# Patient Record
Sex: Male | Born: 2000 | Race: White | Hispanic: No | Marital: Single | Smoking: Never smoker
Health system: Southern US, Community
[De-identification: ages and names within clinical notes are randomized; demographics above are authoritative.]

## PROBLEM LIST (undated history)

## (undated) DIAGNOSIS — F419 Anxiety disorder, unspecified: Secondary | ICD-10-CM

## (undated) DIAGNOSIS — J45909 Unspecified asthma, uncomplicated: Secondary | ICD-10-CM

## (undated) DIAGNOSIS — F32A Depression, unspecified: Secondary | ICD-10-CM

## (undated) HISTORY — DX: Anxiety disorder, unspecified: F41.9

## (undated) HISTORY — DX: Depression, unspecified: F32.A

---
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
## 2022-01-15 DIAGNOSIS — F3289 Other specified depressive episodes: Secondary | ICD-10-CM | POA: Diagnosis not present

## 2022-01-15 DIAGNOSIS — F411 Generalized anxiety disorder: Secondary | ICD-10-CM | POA: Diagnosis not present

## 2022-01-15 DIAGNOSIS — F988 Other specified behavioral and emotional disorders with onset usually occurring in childhood and adolescence: Secondary | ICD-10-CM | POA: Diagnosis not present

## 2022-01-15 DIAGNOSIS — E559 Vitamin D deficiency, unspecified: Secondary | ICD-10-CM | POA: Diagnosis not present

## 2022-01-22 DIAGNOSIS — F909 Attention-deficit hyperactivity disorder, unspecified type: Secondary | ICD-10-CM | POA: Diagnosis not present

## 2022-01-25 ENCOUNTER — Telehealth: Payer: PRIVATE HEALTH INSURANCE | Admitting: Medical-Surgical

## 2022-01-28 ENCOUNTER — Telehealth (INDEPENDENT_AMBULATORY_CARE_PROVIDER_SITE_OTHER): Payer: BC Managed Care – PPO | Admitting: Sports Medicine

## 2022-01-28 DIAGNOSIS — J4530 Mild persistent asthma, uncomplicated: Secondary | ICD-10-CM | POA: Diagnosis not present

## 2022-01-28 MED ORDER — QVAR REDIHALER 80 MCG/ACT IN AERB: 1.0000 | INHALATION_SPRAY | Freq: Two times a day (BID) | RESPIRATORY_TRACT | 11 refills | Status: DC

## 2022-01-28 NOTE — Assessment & Plan Note (Signed)
Benjamin Carpenter is a pleasant 21 year old male, we had recently treated him for an asthma exacerbation, on further questioning he was having nighttime attacks 3 times per week and was only on albuterol. Added low-dose Qvar 40 mg twice a day, he is having approximately 1 nighttime attack per week now. No daytime symptoms over the past month. Increasing Qvar to 80 mg twice a day, return in 6 months. He will need pneumococcal 20 vaccination when he is 21 years old.

## 2022-01-28 NOTE — Progress Notes (Signed)
° °  Virtual Visit via WebEx/MyChart   I connected with  Benjamin Carpenter  on 01/28/22 via WebEx/MyChart/Doximity Video and verified that I am speaking with the correct person using two identifiers.   I discussed the limitations, risks, security and privacy concerns of performing an evaluation and management service by WebEx/MyChart/Doximity Video, including the higher likelihood of inaccurate diagnosis and treatment, and the availability of in person appointments.  We also discussed the likely need of an additional face to face encounter for complete and high quality delivery of care.  I also discussed with the patient that there may be a patient responsible charge related to this service. The patient expressed understanding and wishes to proceed.  Provider location is in medical facility. Patient location is at their home, different from provider location. People involved in care of the patient during this telehealth encounter were myself, my nurse/medical assistant, and my front office/scheduling team member.  Review of Systems: No fevers, chills, night sweats, weight loss, chest pain, or shortness of breath.   Objective Findings:    General: Speaking full sentences, no audible heavy breathing.  Sounds alert and appropriately interactive.  Appears well.  Face symmetric.  Extraocular movements intact.  Pupils equal and round.  No nasal flaring or accessory muscle use visualized.  Independent interpretation of tests performed by another provider:   None.  Brief History, Exam, Impression, and Recommendations:    Mild persistent asthma Benjamin Carpenter is a pleasant 21 year old male, we had recently treated him for an asthma exacerbation, on further questioning he was having nighttime attacks 3 times per week and was only on albuterol. Added low-dose Qvar 40 mg twice a day, he is having approximately 1 nighttime attack per week now. No daytime symptoms over the past month. Increasing Qvar to 80 mg twice a  day, return in 6 months. He will need pneumococcal 20 vaccination when he is 21 years old.   I discussed the above assessment and treatment plan with the patient. The patient was provided an opportunity to ask questions and all were answered. The patient agreed with the plan and demonstrated an understanding of the instructions.   The patient was advised to call back or seek an in-person evaluation if the symptoms worsen or if the condition fails to improve as anticipated.   I provided 30 minutes of face to face and non-face-to-face time during this encounter date, time was needed to gather information, review chart, records, communicate/coordinate with staff remotely, as well as complete documentation.   ___________________________________________ [name]. Benjamin Stain, M.D., ABFM., CAQSM. Primary Care and Sports Medicine [facility]  Adjunct Instructor of Family Medicine  [employer]

## 2022-02-19 DIAGNOSIS — F909 Attention-deficit hyperactivity disorder, unspecified type: Secondary | ICD-10-CM | POA: Diagnosis not present

## 2022-03-26 DIAGNOSIS — F909 Attention-deficit hyperactivity disorder, unspecified type: Secondary | ICD-10-CM | POA: Diagnosis not present

## 2022-04-06 ENCOUNTER — Ambulatory Visit: Payer: BC Managed Care – PPO | Admitting: Medical-Surgical

## 2022-04-06 ENCOUNTER — Encounter: Payer: Self-pay | Admitting: Medical-Surgical

## 2022-04-06 VITALS — BP 106/69 | HR 75 | Resp 20 | Ht 63.58 in | Wt 131.3 lb

## 2022-04-06 DIAGNOSIS — J4531 Mild persistent asthma with (acute) exacerbation: Secondary | ICD-10-CM | POA: Diagnosis not present

## 2022-04-06 MED ORDER — PREDNISONE 10 MG PO TABS: ORAL_TABLET | ORAL | 0 refills | Status: AC

## 2022-04-06 MED ORDER — BUDESONIDE 0.5 MG/2ML IN SUSP: 0.5000 mg | Freq: Two times a day (BID) | RESPIRATORY_TRACT | 12 refills | Status: DC

## 2022-04-06 MED ORDER — ALBUTEROL SULFATE (2.5 MG/3ML) 0.083% IN NEBU: 2.5000 mg | INHALATION_SOLUTION | Freq: Four times a day (QID) | RESPIRATORY_TRACT | 6 refills | Status: DC | PRN

## 2022-04-06 NOTE — Progress Notes (Signed)
?  HPI with pertinent ROS:  ? ?CC: asthma ? ?HPI: ?Pleasant 21 year old male accompanied by his mother presenting today for evaluation a flare in his asthma. Notes the past two weeks have been difficulty. He thinks it started with allergies but has had to increase his use of albuterol to 3 times daily. Using the nebulizer since this seems to work better than the inhaler. Using QVar as prescribed. Taking either Claritin or Allegra. Using a steroid nasal spray daily. Tried Singulair in the past but it gave him night terrors. Reports chest tightness as the main symptoms but also has frequent episodes of wheezing.  ? ?I reviewed the past medical history, family history, social history, surgical history, and allergies today and no changes were needed.  Please see the problem list section below in epic for further details. ? ? ?Physical exam:  ? ?General: Well Developed, well nourished, and in no acute distress.  ?Neuro: Alert and oriented x3.  ?HEENT: Normocephalic, atraumatic.  ?Skin: Warm and dry. ?Cardiac: Regular rate and rhythm, no murmurs rubs or gallops, no lower extremity edema.  ?Respiratory: Scattered expiratory wheezing to posterior left upper and lower lobes, otherwise CTA. Not using accessory muscles, speaking in full sentences. ? ?Impression and Recommendations:   ? ?1. Mild persistent asthma with exacerbation ?Insurance does not cover Symbicort so adding Pulmicort nebulizer twice daily. Use albuterol nebulizer first then do Pulmicort for the next 3 days. Hold QVar while using Pulmicort. Prednisone taper. Consider switching oral antihistamine to Zyrtec or Xyzal. Continue nasal spray.  ? ?Return if symptoms worsen or fail to improve. ?___________________________________________ ?Thayer Ohm, DNP, APRN, FNP-BC ?Primary Care and Sports Medicine ?[facility] ?

## 2022-04-12 ENCOUNTER — Ambulatory Visit: Payer: Managed Care, Other (non HMO) | Admitting: Physician Assistant

## 2022-04-16 DIAGNOSIS — F909 Attention-deficit hyperactivity disorder, unspecified type: Secondary | ICD-10-CM | POA: Diagnosis not present

## 2022-05-12 DIAGNOSIS — F909 Attention-deficit hyperactivity disorder, unspecified type: Secondary | ICD-10-CM | POA: Diagnosis not present

## 2022-05-25 ENCOUNTER — Encounter: Payer: Self-pay | Admitting: Sports Medicine

## 2022-05-25 ENCOUNTER — Other Ambulatory Visit: Payer: Self-pay | Admitting: Sports Medicine

## 2022-05-25 NOTE — Telephone Encounter (Signed)
We have not prescribed these medications for the patient previously.  Please review and refill if appropriate.  T. Daronte Shostak, CMA  

## 2022-05-31 ENCOUNTER — Encounter (INDEPENDENT_AMBULATORY_CARE_PROVIDER_SITE_OTHER): Payer: BC Managed Care – PPO | Admitting: Sports Medicine

## 2022-05-31 DIAGNOSIS — J4530 Mild persistent asthma, uncomplicated: Secondary | ICD-10-CM | POA: Diagnosis not present

## 2022-06-01 MED ORDER — ALBUTEROL SULFATE HFA 108 (90 BASE) MCG/ACT IN AERS: 1.0000 | INHALATION_SPRAY | Freq: Four times a day (QID) | RESPIRATORY_TRACT | 11 refills | Status: DC | PRN

## 2022-06-01 NOTE — Telephone Encounter (Signed)
I spent 5 total minutes of online digital evaluation and management services in this patient-initiated request for online care. 

## 2022-06-04 ENCOUNTER — Telehealth: Payer: Self-pay

## 2022-06-04 DIAGNOSIS — F909 Attention-deficit hyperactivity disorder, unspecified type: Secondary | ICD-10-CM | POA: Diagnosis not present

## 2022-06-04 NOTE — Telephone Encounter (Addendum)
Initiated Prior authorization JSR:[id number] Sulfate HFA 108 (90 Base)MCG/ACT aerosol Via: Covermymeds Case/Key:[id number] Status: n/a as of 06/04/22 Reason:This may mean either your patient does not have active coverage with this plan, this authorization was processed as a duplicate request, or an authorization was not needed for this medication Notified Pt via: Mychart

## 2022-07-13 ENCOUNTER — Encounter: Payer: Self-pay | Admitting: Sports Medicine

## 2022-09-03 DIAGNOSIS — F909 Attention-deficit hyperactivity disorder, unspecified type: Secondary | ICD-10-CM | POA: Diagnosis not present

## 2022-10-01 DIAGNOSIS — F909 Attention-deficit hyperactivity disorder, unspecified type: Secondary | ICD-10-CM | POA: Diagnosis not present

## 2022-10-29 DIAGNOSIS — F909 Attention-deficit hyperactivity disorder, unspecified type: Secondary | ICD-10-CM | POA: Diagnosis not present

## 2022-11-22 ENCOUNTER — Encounter: Payer: Self-pay | Admitting: Sports Medicine

## 2022-11-22 ENCOUNTER — Other Ambulatory Visit: Payer: Self-pay | Admitting: Sports Medicine

## 2022-11-22 ENCOUNTER — Other Ambulatory Visit: Payer: Self-pay

## 2022-11-22 MED ORDER — FLUOXETINE HCL (PMDD) 20 MG PO TABS: 1.0000 | ORAL_TABLET | Freq: Every day | ORAL | 0 refills | Status: DC

## 2022-12-15 ENCOUNTER — Encounter: Payer: Self-pay | Admitting: Medical-Surgical

## 2022-12-15 ENCOUNTER — Ambulatory Visit (INDEPENDENT_AMBULATORY_CARE_PROVIDER_SITE_OTHER): Payer: BC Managed Care – PPO | Admitting: Medical-Surgical

## 2022-12-15 VITALS — BP 95/58 | HR 76 | Temp 98.0°F | Resp 20 | Ht 63.71 in | Wt 120.9 lb

## 2022-12-15 DIAGNOSIS — J329 Chronic sinusitis, unspecified: Secondary | ICD-10-CM

## 2022-12-15 DIAGNOSIS — J4531 Mild persistent asthma with (acute) exacerbation: Secondary | ICD-10-CM

## 2022-12-15 DIAGNOSIS — J40 Bronchitis, not specified as acute or chronic: Secondary | ICD-10-CM | POA: Diagnosis not present

## 2022-12-15 DIAGNOSIS — J029 Acute pharyngitis, unspecified: Secondary | ICD-10-CM | POA: Diagnosis not present

## 2022-12-15 LAB — POCT RAPID STREP A (OFFICE): Rapid Strep A Screen: NEGATIVE

## 2022-12-15 LAB — POCT INFLUENZA A/B
Influenza A, POC: NEGATIVE
Influenza B, POC: NEGATIVE

## 2022-12-15 LAB — POC COVID19 BINAXNOW: SARS Coronavirus 2 Ag: NEGATIVE

## 2022-12-15 MED ORDER — QVAR REDIHALER 80 MCG/ACT IN AERB: 1.0000 | INHALATION_SPRAY | Freq: Two times a day (BID) | RESPIRATORY_TRACT | 11 refills | Status: DC

## 2022-12-15 MED ORDER — AMOXICILLIN-POT CLAVULANATE 875-125 MG PO TABS: 1.0000 | ORAL_TABLET | Freq: Two times a day (BID) | ORAL | 0 refills | Status: DC

## 2022-12-15 MED ORDER — PREDNISONE 50 MG PO TABS
50.0000 mg | ORAL_TABLET | Freq: Every day | ORAL | 0 refills | Status: DC
Start: 2022-12-15 — End: 2023-04-08

## 2022-12-15 MED ORDER — ALBUTEROL SULFATE (2.5 MG/3ML) 0.083% IN NEBU: 2.5000 mg | INHALATION_SOLUTION | Freq: Four times a day (QID) | RESPIRATORY_TRACT | 1 refills | Status: DC | PRN

## 2022-12-15 NOTE — Addendum Note (Signed)
Addended by: Beverlee Nims on: 12/15/2022 11:36 AM   Modules accepted: Orders

## 2022-12-15 NOTE — Progress Notes (Signed)
Established Patient Office Visit  Subjective   Patient ID: Benjamin Carpenter, male   DOB: Apr 24, 2001 Age: 22 y.o. MRN: [id number]   Chief Complaint  Patient presents with   Headache   Nasal Congestion   Sore Throat   HPI Pleasant 22 year old male accompanied by his mother presenting today for upper respiratory symptoms that started a couple of days before Christmas.  He had 2 days of symptoms which spontaneously improved.  Several days after Christmas, he started running a fever again and has since been dealing with sinus congestion, sinus headaches, rhinorrhea, postnasal drip, productive cough, and mild shortness of breath.  Has been using nebulizer albuterol every 4 hours pretty regularly although he has missed a couple of the 4-hour increments while he was sleeping.  Fever Tmax 102.  Reports that his cough is productive however he tends to cough it up and then swallow it back down without looking at it.  Has had some blood-tinged in his nasal drainage.  Eating and drinking with no difficulty.   Objective:    Vitals:   12/15/22 1053  BP: (!) 95/58  Pulse: 76  Temp: 98 F (36.7 C)  Resp: 20  Height: 5' 3.71" (1.618 m)  Weight: 120 lb 14.4 oz (54.8 kg)  SpO2: 97%  BMI (Calculated): 20.95    Physical Exam Vitals reviewed.  Constitutional:      General: He is not in acute distress.    Appearance: Normal appearance. He is obese. He is not ill-appearing.  HENT:     Head: Normocephalic and atraumatic.     Nose: Rhinorrhea present. Rhinorrhea is clear.     Right Turbinates: Swollen.     Left Turbinates: Swollen.     Right Sinus: No maxillary sinus tenderness or frontal sinus tenderness.     Left Sinus: No maxillary sinus tenderness or frontal sinus tenderness.     Mouth/Throat:     Mouth: Mucous membranes are moist.  Eyes:     General: No scleral icterus.    Extraocular Movements: Extraocular movements intact.     Pupils: Pupils are equal, round, and reactive to light.   Cardiovascular:     Rate and Rhythm: Normal rate.     Pulses: Normal pulses.     Heart sounds: Normal heart sounds. No murmur heard.    No friction rub. No gallop.  Pulmonary:     Effort: Pulmonary effort is normal. No respiratory distress.     Breath sounds: Wheezing (Mild diffuse inspiratory wheezing) and rhonchi (Scattered) present.  Lymphadenopathy:     Cervical: No cervical adenopathy.  Skin:    General: Skin is warm and dry.  Neurological:     Mental Status: He is alert and oriented to person, place, and time.  Psychiatric:        Mood and Affect: Mood normal.        Behavior: Behavior normal.        Thought Content: Thought content normal.        Judgment: Judgment normal.   No results found for this or any previous visit (from the past 24 hour(s)).     The ASCVD Risk score (Arnett DK, et al., 2019) failed to calculate for the following reasons:   The 2019 ASCVD risk score is only valid for ages 58 to 23   Assessment & Plan:   1. Mild persistent asthma with acute exacerbation 2. Sinobronchitis Greater than 6 days of symptoms with resickening.  Treating with Augmentin twice daily  x 7 days.  Adding prednisone 50 mg daily.  Sending in refill of albuterol nebulizers since this has been helpful.  Recommend completing an albuterol nebulizer in the morning followed by Pulmicort and repeat this in the afternoon.  Hold Qvar for now.  Once symptoms start to improve, okay to stop nebulizers and use albuterol and Qvar inhalers as prescribed.  Discussed possible chest x-ray given inspiratory wheezing and mild scattered rhonchi.  Holding off on this for now but if symptoms do not improve, may need to do this in the next week or so.  Return if symptoms worsen or fail to improve.  ___________________________________________ Clearnce Sorrel, DNP, APRN, FNP-BC Primary Care and [place]

## 2022-12-23 ENCOUNTER — Other Ambulatory Visit: Payer: Self-pay | Admitting: Sports Medicine

## 2023-01-21 ENCOUNTER — Encounter: Payer: Self-pay | Admitting: Sports Medicine

## 2023-01-21 NOTE — Telephone Encounter (Signed)
A PA has been started for fluoxetine. Through CoverMyMeds.   DAYVION SLUIS ([id number])

## 2023-01-21 NOTE — Telephone Encounter (Signed)
PA Case: [id number], Status: Approved, Coverage Starts on: 01/21/2023 12:00:00 AM, Coverage Ends on: 01/21/2024 12:00:00 AM.

## 2023-02-10 ENCOUNTER — Encounter (INDEPENDENT_AMBULATORY_CARE_PROVIDER_SITE_OTHER): Payer: BC Managed Care – PPO | Admitting: Sports Medicine

## 2023-02-10 DIAGNOSIS — F840 Autistic disorder: Secondary | ICD-10-CM

## 2023-02-13 MED ORDER — CLONIDINE HCL 0.2 MG PO TABS: 0.2000 mg | ORAL_TABLET | Freq: Every day | ORAL | 0 refills | Status: DC

## 2023-02-13 NOTE — Telephone Encounter (Signed)
Sending refill, small amount, patient has not been seen in over 1 year, follow-up appointment needed.  I spent 5 total minutes of online digital evaluation and management services in this patient-initiated request for online care.

## 2023-02-22 ENCOUNTER — Ambulatory Visit (INDEPENDENT_AMBULATORY_CARE_PROVIDER_SITE_OTHER): Payer: BC Managed Care – PPO | Admitting: Sports Medicine

## 2023-02-22 ENCOUNTER — Encounter: Payer: Self-pay | Admitting: Sports Medicine

## 2023-02-22 ENCOUNTER — Other Ambulatory Visit: Payer: Self-pay | Admitting: Sports Medicine

## 2023-02-22 VITALS — BP 104/69 | HR 73

## 2023-02-22 DIAGNOSIS — Z0000 Encounter for general adult medical examination without abnormal findings: Secondary | ICD-10-CM

## 2023-02-22 DIAGNOSIS — Z68.41 Body mass index (BMI) pediatric, 5th percentile to less than 85th percentile for age: Secondary | ICD-10-CM

## 2023-02-22 DIAGNOSIS — F840 Autistic disorder: Secondary | ICD-10-CM

## 2023-02-22 MED ORDER — CLONIDINE HCL 0.2 MG PO TABS: 0.2000 mg | ORAL_TABLET | Freq: Every day | ORAL | 3 refills | Status: DC

## 2023-02-22 MED ORDER — FLUOXETINE HCL 20 MG PO TABS: 20.0000 mg | ORAL_TABLET | Freq: Every day | ORAL | 3 refills | Status: DC

## 2023-02-22 NOTE — Progress Notes (Signed)
    Procedures performed today:    None.  Independent interpretation of notes and tests performed by another provider:   None.  Brief History, Exam, Impression, and Recommendations:    Autism spectrum disorder requiring support (level 1) Doing extremely well on on sertraline and clonidine, refilling medications. Merrily Pew is functional, he graduated from school and is working part-time for now.  Annual physical exam Due for HPV vaccine, he will talk to his parents about this. Checking routine labs. Return in 1 year for fasting annual physical.    ____________________________________________ Gwen Her. Dianah Field, M.D., ABFM., CAQSM., AME. Primary Care and Sports Medicine [facility]  Adjunct Professor of [place] [employer] [facility] of Medicine  Risk manager

## 2023-02-22 NOTE — Assessment & Plan Note (Signed)
Doing extremely well on on sertraline and clonidine, refilling medications. Benjamin Carpenter is functional, he graduated from school and is working part-time for now.

## 2023-02-22 NOTE — Assessment & Plan Note (Signed)
Due for HPV vaccine, he will talk to his parents about this. Checking routine labs. Return in 1 year for fasting annual physical.

## 2023-02-22 NOTE — Assessment & Plan Note (Signed)
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 Benjamin Carpenter is functional, he graduated from school and is working part-time for now.

## 2023-02-23 LAB — LIPID PANEL
Cholesterol: 140 mg/dL (ref <170)
HDL: 50 mg/dL (ref >45)
LDL Cholesterol (Calc): 72 mg/dL (calc) (ref <110)
Non-HDL Cholesterol (Calc): 90 mg/dL (calc) (ref <120)
Total CHOL/HDL Ratio: 2.8 (calc) (ref <5.0)
Triglycerides: 95 mg/dL — ABNORMAL HIGH (ref <90)

## 2023-02-23 LAB — COMPREHENSIVE METABOLIC PANEL
AG Ratio: 2.3 (calc) (ref 1.0–2.5)
ALT: 18 U/L (ref 8–46)
AST: 19 U/L (ref 12–32)
Albumin: 4.8 g/dL (ref 3.6–5.1)
Alkaline phosphatase (APISO): 113 U/L (ref 46–169)
BUN: 9 mg/dL (ref 7–20)
CO2: 26 mmol/L (ref 20–32)
Calcium: 9.6 mg/dL (ref 8.9–10.4)
Chloride: 107 mmol/L (ref 98–110)
Creat: 1 mg/dL (ref 0.60–1.24)
Globulin: 2.1 g/dL (calc) (ref 2.1–3.5)
Glucose, Bld: 88 mg/dL (ref 65–99)
Potassium: 4.1 mmol/L (ref 3.8–5.1)
Sodium: 144 mmol/L (ref 135–146)
Total Bilirubin: 0.7 mg/dL (ref 0.2–1.1)
Total Protein: 6.9 g/dL (ref 6.3–8.2)

## 2023-02-23 LAB — CBC
HCT: 49 % (ref 38.5–50.0)
Hemoglobin: 16.6 g/dL (ref 13.2–17.1)
MCH: 30.1 pg (ref 27.0–33.0)
MCHC: 33.9 g/dL (ref 32.0–36.0)
MCV: 88.9 fL (ref 80.0–100.0)
MPV: 9 fL (ref 7.5–12.5)
Platelets: 244 10*3/uL (ref 140–400)
RBC: 5.51 10*6/uL (ref 4.20–5.80)
RDW: 13 % (ref 11.0–15.0)
WBC: 6.7 10*3/uL (ref 3.8–10.8)

## 2023-02-23 LAB — HEMOGLOBIN A1C
Hgb A1c MFr Bld: 5.5 % of total Hgb (ref <5.7)
Mean Plasma Glucose: 111 mg/dL
eAG (mmol/L): 6.2 mmol/L

## 2023-02-23 LAB — TSH: TSH: 2.33 mIU/L (ref 0.50–4.30)

## 2023-03-12 ENCOUNTER — Other Ambulatory Visit: Payer: Self-pay | Admitting: Sports Medicine

## 2023-04-08 ENCOUNTER — Ambulatory Visit (INDEPENDENT_AMBULATORY_CARE_PROVIDER_SITE_OTHER): Payer: BC Managed Care – PPO | Admitting: Sports Medicine

## 2023-04-08 ENCOUNTER — Ambulatory Visit (INDEPENDENT_AMBULATORY_CARE_PROVIDER_SITE_OTHER): Payer: BC Managed Care – PPO

## 2023-04-08 ENCOUNTER — Encounter: Payer: Self-pay | Admitting: Sports Medicine

## 2023-04-08 DIAGNOSIS — J4530 Mild persistent asthma, uncomplicated: Secondary | ICD-10-CM

## 2023-04-08 DIAGNOSIS — J4531 Mild persistent asthma with (acute) exacerbation: Secondary | ICD-10-CM | POA: Diagnosis not present

## 2023-04-08 DIAGNOSIS — R062 Wheezing: Secondary | ICD-10-CM | POA: Diagnosis not present

## 2023-04-08 MED ORDER — MONTELUKAST SODIUM 10 MG PO TABS: 10.0000 mg | ORAL_TABLET | Freq: Every day | ORAL | 3 refills | Status: DC

## 2023-04-08 MED ORDER — PREDNISONE 50 MG PO TABS: 50.0000 mg | ORAL_TABLET | Freq: Every day | ORAL | 0 refills | Status: DC

## 2023-04-08 MED ORDER — ALBUTEROL SULFATE HFA 108 (90 BASE) MCG/ACT IN AERS: 1.0000 | INHALATION_SPRAY | Freq: Four times a day (QID) | RESPIRATORY_TRACT | 11 refills | Status: DC | PRN

## 2023-04-08 MED ORDER — QVAR REDIHALER 80 MCG/ACT IN AERB: 1.0000 | INHALATION_SPRAY | Freq: Two times a day (BID) | RESPIRATORY_TRACT | 11 refills | Status: DC

## 2023-04-08 NOTE — Assessment & Plan Note (Signed)
Pleasant 22 year old male, he is having worsening asthma symptoms with wheezing, coughing, shortness of breath. He has stopped his Qvar, he is currently taking Allegra. He does use his albuterol, he is not using Pulmicort. We will refill his Qvar, he will continue Allegra, adding Singulair. I will do a burst of steroids and a chest x-ray today. We will also refill his albuterol. Return to see me in a month to reevaluate asthma control, if insufficient improvement we will consider switching from inhaled corticosteroid to an ICS/LABA combo. We will also discussed pneumococcal 20 vaccination at the follow-up.

## 2023-04-08 NOTE — Progress Notes (Signed)
    Procedures performed today:    None.  Independent interpretation of notes and tests performed by another provider:   None.  Brief History, Exam, Impression, and Recommendations:    Mild persistent asthma Pleasant 22 year old male, he is having worsening asthma symptoms with wheezing, coughing, shortness of breath. He has stopped his Qvar, he is currently taking Allegra. He does use his albuterol, he is not using Pulmicort. We will refill his Qvar, he will continue Allegra, adding Singulair. I will do a burst of steroids and a chest x-ray today. We will also refill his albuterol. Return to see me in a month to reevaluate asthma control, if insufficient improvement we will consider switching from inhaled corticosteroid to an ICS/LABA combo. We will also discussed pneumococcal 20 vaccination at the follow-up.    ____________________________________________ Benjamin Carpenter. Benjamin Stain, M.D., ABFM., CAQSM., AME. Primary Care and Sports Medicine [facility]  Adjunct Professor of Family Medicine  [place] [employer] [facility] [employer]  Restaurant manager, fast food

## 2023-04-11 ENCOUNTER — Other Ambulatory Visit: Payer: Self-pay | Admitting: Sports Medicine

## 2023-04-17 ENCOUNTER — Encounter: Payer: Self-pay | Admitting: Sports Medicine

## 2023-04-28 ENCOUNTER — Telehealth: Payer: Self-pay

## 2023-04-28 NOTE — Telephone Encounter (Signed)
Initiated Prior authorization ZOX:WRUE RediHaler 80MCG/ACT aerosol Via: Covermymeds Case/Key:[id number] Status: approved  as of 04/28/23 Reason: Coverage Starts on: 04/28/2023 12:00:00 AM, Coverage Ends on: 04/27/2024 12:00:00 AM. Notified Pt via: Mychart

## 2023-05-06 ENCOUNTER — Ambulatory Visit: Payer: BC Managed Care – PPO | Admitting: Sports Medicine

## 2023-05-09 ENCOUNTER — Other Ambulatory Visit: Payer: Self-pay | Admitting: Sports Medicine

## 2023-05-13 ENCOUNTER — Ambulatory Visit (INDEPENDENT_AMBULATORY_CARE_PROVIDER_SITE_OTHER): Payer: BC Managed Care – PPO | Admitting: Sports Medicine

## 2023-05-13 ENCOUNTER — Encounter: Payer: Self-pay | Admitting: Sports Medicine

## 2023-05-13 VITALS — BP 117/73 | HR 69

## 2023-05-13 DIAGNOSIS — R5383 Other fatigue: Secondary | ICD-10-CM

## 2023-05-13 DIAGNOSIS — E538 Deficiency of other specified B group vitamins: Secondary | ICD-10-CM

## 2023-05-13 DIAGNOSIS — J4530 Mild persistent asthma, uncomplicated: Secondary | ICD-10-CM

## 2023-05-13 DIAGNOSIS — E559 Vitamin D deficiency, unspecified: Secondary | ICD-10-CM

## 2023-05-13 LAB — CBC
MCH: 29.8 pg (ref 27.0–33.0)
MCHC: 33 g/dL (ref 32.0–36.0)
MCV: 90.2 fL (ref 80.0–100.0)

## 2023-05-13 MED ORDER — CLONIDINE HCL 0.2 MG PO TABS: 0.2000 mg | ORAL_TABLET | Freq: Every day | ORAL | 3 refills | Status: DC

## 2023-05-13 NOTE — Assessment & Plan Note (Signed)
Was having exacerbation at the last visit, he had stopped taking his Qvar, only taking Allegra, we treated him for exacerbation with steroids, got a chest x-ray and refill albuterol, he never got his Qvar, but just got approved. He never took the Singulair, tells me he had bad dreams with this before so we will take this off of his list. He did have some inspiratory wheezes right upper lobe today, once he gets the Qvar for 6 weeks I would like to auscultate his lungs again. He also may be due for pneumococcal 20 but he is in a check with his mom first.

## 2023-05-13 NOTE — Assessment & Plan Note (Signed)
Nonspecific, his mother thinks he is somewhat sleepier than usual. They are requesting labs so I am happy to do this.

## 2023-05-13 NOTE — Progress Notes (Signed)
    Procedures performed today:    None.  Independent interpretation of notes and tests performed by another provider:   None.  Brief History, Exam, Impression, and Recommendations:    Mild persistent asthma Was having exacerbation at the last visit, he had stopped taking his Qvar, only taking Allegra, we treated him for exacerbation with steroids, got a chest x-ray and refill albuterol, he never got his Qvar, but just got approved. He never took the Singulair, tells me he had bad dreams with this before so we will take this off of his list. He did have some inspiratory wheezes right upper lobe today, once he gets the Qvar for 6 weeks I would like to auscultate his lungs again. He also may be due for pneumococcal 20 but he is in a check with his mom first.   Fatigue Nonspecific, his mother thinks he is somewhat sleepier than usual. They are requesting labs so I am happy to do this.    ____________________________________________ [name]. Benjamin Stain, M.D., ABFM., CAQSM., AME. Primary Care and Sports Medicine [facility]  Adjunct Professor of Family Medicine  [place] of [facility] [employer]  Restaurant manager, fast food

## 2023-05-14 LAB — COMPREHENSIVE METABOLIC PANEL
AG Ratio: 2.2 (calc) (ref 1.0–2.5)
ALT: 21 U/L (ref 8–46)
AST: 22 U/L (ref 12–32)
Albumin: 4.8 g/dL (ref 3.6–5.1)
Alkaline phosphatase (APISO): 92 U/L (ref 46–169)
BUN: 12 mg/dL (ref 7–20)
CO2: 26 mmol/L (ref 20–32)
Calcium: 9.5 mg/dL (ref 8.9–10.4)
Chloride: 107 mmol/L (ref 98–110)
Creat: 1 mg/dL (ref 0.60–1.24)
Globulin: 2.2 g/dL (calc) (ref 2.1–3.5)
Glucose, Bld: 89 mg/dL (ref 65–99)
Potassium: 4.5 mmol/L (ref 3.8–5.1)
Sodium: 141 mmol/L (ref 135–146)
Total Bilirubin: 0.7 mg/dL (ref 0.2–1.1)
Total Protein: 7 g/dL (ref 6.3–8.2)

## 2023-05-14 LAB — CBC
HCT: 50.6 % — ABNORMAL HIGH (ref 38.5–50.0)
Hemoglobin: 16.7 g/dL (ref 13.2–17.1)
MPV: 8.8 fL (ref 7.5–12.5)
Platelets: 268 Thousand/uL (ref 140–400)
RBC: 5.61 Million/uL (ref 4.20–5.80)
RDW: 12.1 % (ref 11.0–15.0)
WBC: 5.9 10*3/uL (ref 3.8–10.8)

## 2023-05-14 LAB — VITAMIN D 25 HYDROXY (VIT D DEFICIENCY, FRACTURES): Vit D, 25-Hydroxy: 49 ng/mL (ref 30–100)

## 2023-05-14 LAB — IRON,TIBC AND FERRITIN PANEL
%SAT: 27 % (calc) (ref 16–48)
Ferritin: 56 ng/mL (ref 38–380)
Iron: 108 ug/dL (ref 27–164)
TIBC: 399 mcg/dL (calc) (ref 271–448)

## 2023-05-14 LAB — B12 AND FOLATE PANEL
Folate: 12.2 ng/mL
Vitamin B-12: 556 pg/mL (ref 200–1100)

## 2023-05-14 LAB — TSH: TSH: 2.19 mIU/L (ref 0.50–4.30)

## 2023-06-08 ENCOUNTER — Encounter: Payer: Self-pay | Admitting: Sports Medicine

## 2023-06-24 ENCOUNTER — Ambulatory Visit (INDEPENDENT_AMBULATORY_CARE_PROVIDER_SITE_OTHER): Payer: BC Managed Care – PPO | Admitting: Sports Medicine

## 2023-06-24 DIAGNOSIS — J4530 Mild persistent asthma, uncomplicated: Secondary | ICD-10-CM | POA: Diagnosis not present

## 2023-06-24 MED ORDER — QVAR REDIHALER 80 MCG/ACT IN AERB: 1.0000 | INHALATION_SPRAY | Freq: Two times a day (BID) | RESPIRATORY_TRACT | 11 refills | Status: DC

## 2023-06-24 NOTE — Assessment & Plan Note (Signed)
Doing well, still not taking Qvar, rarely uses albuterol, has still not checked with his mother regarding pneumococcal 20, we will go and send in his Qvar again, he has had multiple insurance changes.

## 2023-06-24 NOTE — Progress Notes (Signed)
    Procedures performed today:    None.  Independent interpretation of notes and tests performed by another provider:   None.  Brief History, Exam, Impression, and Recommendations:    Mild persistent asthma Doing well, still not taking Qvar, rarely uses albuterol, has still not checked with his mother regarding pneumococcal 20, we will go and send in his Qvar again, he has had multiple insurance changes.    ____________________________________________ [name]. Benjamin Stain, M.D., ABFM., CAQSM., AME. Primary Care and Sports Medicine [facility] [facility]  Adjunct Professor of Family Medicine  [place] of [facility] [employer]  Restaurant manager, fast food

## 2023-07-11 ENCOUNTER — Encounter: Payer: Self-pay | Admitting: Sports Medicine

## 2023-07-14 ENCOUNTER — Telehealth: Payer: Self-pay

## 2023-07-14 NOTE — Telephone Encounter (Signed)
Initiated Prior authorization QMV:HQIO RediHaler 80MCG/ACT aerosol Via: Covermymeds Case/Key:[id number] Status: Pending as of 07/14/23 Reason: Notified Pt via: Mychart

## 2023-08-17 ENCOUNTER — Telehealth (INDEPENDENT_AMBULATORY_CARE_PROVIDER_SITE_OTHER): Payer: BC Managed Care – PPO | Admitting: Sports Medicine

## 2023-08-17 DIAGNOSIS — F411 Generalized anxiety disorder: Secondary | ICD-10-CM

## 2023-08-17 MED ORDER — FLUOXETINE HCL 40 MG PO CAPS
40.0000 mg | ORAL_CAPSULE | Freq: Every day | ORAL | 3 refills | Status: DC
Start: 2023-08-17 — End: 2024-01-31

## 2023-08-17 NOTE — Assessment & Plan Note (Signed)
This is a very pleasant 22 year old male, he is having a visit today to discuss his mood, he reports some anxiety and potentially depressive symptoms without suicidal or homicidal ideation. He recalls trying to find work to make $1000 to help pay for a beach trip, he was not able to make the money and was not able to get the work he wanted, he was still able to do the beach trip but was somewhat stressed out the entire time. Overall he did have a good time, and is here to discuss symptomatology. He is currently on fluoxetine 20 mg daily, he has no suicidal or homicidal ideation, he is working with a Warden/ranger at [employer]. He does have an appointment set up on Friday 2 days from now. It is difficult for me to ascertain whether or not his depressive symptoms or anxiety symptoms have worsened or if this is merely an adjustment disorder due to difficulty making sufficient money for his trip. I am able to sense some flat affect, he does appear to have some psychomotor retardation, he is sleeping more than usual but denies any changes in appetite, he denies any symptoms of anhedonia. We will have them work on a PHQ-9 and GAD-7 now, and then in 6 weeks before the follow-up visit. Due to his symptoms I would like to go ahead and bump his fluoxetine up to 40 mg for maybe 6 to 12 weeks before considering bumping back down again. They will have his psychologist fax me reports of their visit.

## 2023-08-17 NOTE — Progress Notes (Signed)
Virtual Visit via WebEx/MyChart   I connected with  Benjamin Carpenter  on 08/17/23 via WebEx/MyChart/Doximity Video and verified that I am speaking with the correct person using two identifiers.   I discussed the limitations, risks, security and privacy concerns of performing an evaluation and management service by WebEx/MyChart/Doximity Video, including the higher likelihood of inaccurate diagnosis and treatment, and the availability of in person appointments.  We also discussed the likely need of an additional face to face encounter for complete and high quality delivery of care.  I also discussed with the patient that there may be a patient responsible charge related to this service. The patient expressed understanding and wishes to proceed.  Provider location is in medical facility. Patient location is at their home, different from provider location. People involved in care of the patient during this telehealth encounter were myself, my nurse/medical assistant, and my front office/scheduling team member.  Review of Systems: No fevers, chills, night sweats, weight loss, chest pain, or shortness of breath.   Objective Findings:    General: Speaking full sentences, no audible heavy breathing.  Sounds alert and appropriately interactive.  Appears well.  Face symmetric.  Extraocular movements intact.  Pupils equal and round.  No nasal flaring or accessory muscle use visualized.  Independent interpretation of tests performed by another provider:   None.  Brief History, Exam, Impression, and Recommendations:    Generalized anxiety disorder This is a very pleasant 22 year old male, he is having a visit today to discuss his mood, he reports some anxiety and potentially depressive symptoms without suicidal or homicidal ideation. He recalls trying to find work to make $1000 to help pay for a beach trip, he was not able to make the money and was not able to get the work he wanted, he was still able  to do the beach trip but was somewhat stressed out the entire time. Overall he did have a good time, and is here to discuss symptomatology. He is currently on fluoxetine 20 mg daily, he has no suicidal or homicidal ideation, he is working with a Warden/ranger at [employer]. He does have an appointment set up on Friday 2 days from now. It is difficult for me to ascertain whether or not his depressive symptoms or anxiety symptoms have worsened or if this is merely an adjustment disorder due to difficulty making sufficient money for his trip. I am able to sense some flat affect, he does appear to have some psychomotor retardation, he is sleeping more than usual but denies any changes in appetite, he denies any symptoms of anhedonia. We will have them work on a PHQ-9 and GAD-7 now, and then in 6 weeks before the follow-up visit. Due to his symptoms I would like to go ahead and bump his fluoxetine up to 40 mg for maybe 6 to 12 weeks before considering bumping back down again. They will have his psychologist fax me reports of their visit.   I discussed the above assessment and treatment plan with the patient. The patient was provided an opportunity to ask questions and all were answered. The patient agreed with the plan and demonstrated an understanding of the instructions.   The patient was advised to call back or seek an in-person evaluation if the symptoms worsen or if the condition fails to improve as anticipated.   I provided 30 minutes of face to face and non-face-to-face time during this encounter date, time was needed to gather information, review chart, records,  communicate/coordinate with staff remotely, as well as complete documentation.   ____________________________________________ Benjamin Carpenter. Benjamin Stain, M.D., ABFM., CAQSM., AME. Primary Care and Sports Medicine [facility] [facility]  Adjunct Professor of Family Medicine  [place] [employer] [facility] [employer]  Restaurant manager, fast food

## 2023-11-14 ENCOUNTER — Encounter: Payer: Self-pay | Admitting: Sports Medicine

## 2023-11-19 ENCOUNTER — Other Ambulatory Visit: Payer: Self-pay

## 2023-11-19 ENCOUNTER — Emergency Department (HOSPITAL_COMMUNITY)
Admission: EM | Admit: 2023-11-19 | Discharge: 2023-11-19 | Disposition: A | Discharge status: Home / Self Care | Payer: BC Managed Care – PPO | Attending: Emergency Medicine | Admitting: Emergency Medicine

## 2023-11-19 ENCOUNTER — Encounter (HOSPITAL_COMMUNITY): Payer: Self-pay

## 2023-11-19 DIAGNOSIS — S61411A Laceration without foreign body of right hand, initial encounter: Secondary | ICD-10-CM | POA: Insufficient documentation

## 2023-11-19 DIAGNOSIS — W01198A Fall on same level from slipping, tripping and stumbling with subsequent striking against other object, initial encounter: Secondary | ICD-10-CM | POA: Insufficient documentation

## 2023-11-19 DIAGNOSIS — J45909 Unspecified asthma, uncomplicated: Secondary | ICD-10-CM | POA: Diagnosis not present

## 2023-11-19 DIAGNOSIS — Z23 Encounter for immunization: Secondary | ICD-10-CM | POA: Diagnosis not present

## 2023-11-19 DIAGNOSIS — S6991XA Unspecified injury of right wrist, hand and finger(s), initial encounter: Secondary | ICD-10-CM | POA: Diagnosis present

## 2023-11-19 MED ORDER — TETANUS-DIPHTH-ACELL PERTUSSIS 5-2.5-18.5 LF-MCG/0.5 IM SUSY
0.5000 mL | PREFILLED_SYRINGE | Freq: Once | INTRAMUSCULAR | Status: AC
  Administered 2023-11-19: 0.5 mL via INTRAMUSCULAR
  Filled 2023-11-19: qty 0.5

## 2023-11-19 MED ORDER — LIDOCAINE-EPINEPHRINE (PF) 2 %-1:200000 IJ SOLN
10.0000 mL | Freq: Once | INTRAMUSCULAR | Status: AC
  Administered 2023-11-19: 10 mL
  Filled 2023-11-19: qty 20

## 2023-11-19 NOTE — Discharge Instructions (Addendum)
Avoid soaking your wound in stagnant or dirty water such as while taking a bath. You can shower normally. Keep the area clean with mild soap and warm water. Do not apply peroxide or alcohol to your wound as this can break down newly forming skin and prolong wound healing. If you keep the area bandaged, change the dressing/bandage at least once per day. Have your staples/sutures removed in 10-12 days.

## 2023-11-19 NOTE — ED Provider Notes (Signed)
[place] EMERGENCY DEPARTMENT AT [facility] Provider Note   CSN: [id number] Arrival date & time: 11/19/23  2208     History  Chief Complaint  Patient presents with   Laceration    Palm of right hand    Benjamin Carpenter is a 22 y.o. male.  22 y/o male with hx of asthma and depression presents for laceration to the palm of the R hand.  Patient reports falling at 2130 and had his right palm and cut by the head of the screw.  Reports some soreness to the area.  No numbness, tingling, limited range of motion of digits.  Tetanus unknown.  The history is provided by the patient. No language interpreter was used.  Laceration      Home Medications Prior to Admission medications   Medication Sig Start Date End Date Taking? Authorizing Provider  albuterol (PROVENTIL) (2.5 MG/3ML) 0.083% nebulizer solution Take 3 mLs (2.5 mg total) by nebulization every 6 (six) hours as needed for wheezing or shortness of breath. 12/15/22   Christen Butter, NP  albuterol (VENTOLIN HFA) 108 (90 Base) MCG/ACT inhaler Inhale 1-2 puffs into the lungs every 6 (six) hours as needed for wheezing or shortness of breath. 04/08/23   Monica Becton, MD  amoxicillin-clavulanate (AUGMENTIN) 875-125 MG tablet Take 1 tablet by mouth 2 (two) times daily. 12/15/22   Christen Butter, NP  beclomethasone (QVAR REDIHALER) 80 MCG/ACT inhaler Inhale 1 puff into the lungs 2 (two) times daily. 06/24/23   Monica Becton, MD  cloNIDine (CATAPRES) 0.2 MG tablet Take 1 tablet (0.2 mg total) by mouth at bedtime. 05/13/23   Monica Becton, MD  FLUoxetine (PROZAC) 40 MG capsule Take 1 capsule (40 mg total) by mouth daily. 08/17/23   Monica Becton, MD      Allergies    Corn oil, Northern qu[place]og clam (m. mercenaria) skin test, Singulair [montelukast], Tilactase, and Wheat    Review of Systems   Review of Systems Ten systems reviewed and are negative for acute change, except as noted in the HPI.    Physical  Exam Updated Vital Signs BP 122/84 (BP Location: Left Arm)   Pulse 75   Temp 98 F (36.7 C) (Oral)   Resp 16   SpO2 98%   Physical Exam Vitals and nursing note reviewed.  Constitutional:      General: He is not in acute distress.    Appearance: He is well-developed. He is not diaphoretic.     Comments: Nontoxic appearing and in NAD  HENT:     Head: Normocephalic and atraumatic.  Eyes:     General: No scleral icterus.    Conjunctiva/sclera: Conjunctivae normal.  Cardiovascular:     Rate and Rhythm: Normal rate and regular rhythm.     Pulses: Normal pulses.     Comments: Distal radial pulse 2+ in the RUE Pulmonary:     Effort: Pulmonary effort is normal. No respiratory distress.     Comments: Respirations even and unlabored Musculoskeletal:        General: Normal range of motion.     Cervical back: Normal range of motion.     Comments: Able to move all fingers of the R hand. Can make a closed fist. Preserved finger to thumb opposition.  Skin:    General: Skin is warm and dry.     Coloration: Skin is not pale.     Findings: No erythema or rash.  Neurological:     Mental  Status: He is alert and oriented to person, place, and time.     Coordination: Coordination normal.  Psychiatric:        Behavior: Behavior normal.     ED Results / Procedures / Treatments   Labs (all labs ordered are listed, but only abnormal results are displayed) Labs Reviewed - No data to display  EKG None  Radiology No results found.  Procedures .Laceration Repair  Date/Time: 11/19/2023 11:45 PM  Performed by: Antony Madura, PA-C Authorized by: Antony Madura, PA-C   Consent:    Consent obtained:  Verbal and emergent situation   Consent given by:  Patient   Risks, benefits, and alternatives were discussed: yes     Risks discussed:  Infection, poor cosmetic result and poor wound healing   Alternatives discussed:  No treatment Universal protocol:    Procedure explained and questions  answered to patient or proxy's satisfaction: yes     Relevant documents present and verified: yes     Test results available: yes     Imaging studies available: yes     Required blood products, implants, devices, and special equipment available: yes     Site/side marked: yes     Immediately prior to procedure, a time out was called: yes     Patient identity confirmed:  Verbally with patient and arm band Anesthesia:    Anesthesia method:  Local infiltration   Local anesthetic:  Lidocaine 2% WITH epi Laceration details:    Location:  Hand   Hand location:  R palm   Length (cm):  2.5 Pre-procedure details:    Preparation:  Patient was prepped and draped in usual sterile fashion Exploration:    Hemostasis achieved with:  Direct pressure   Imaging outcome: foreign body not noted   Treatment:    Area cleansed with:  Povidone-iodine   Amount of cleaning:  Standard   Irrigation solution:  Tap water   Irrigation volume:  >1 liter   Irrigation method:  Pressure wash   Debridement:  None   Scar revision: no   Skin repair:    Repair method:  Sutures   Suture size:  4-0   Wound skin closure material used: Ethilon.   Suture technique:  Horizontal mattress   Number of sutures:  2 Approximation:    Approximation:  Close Repair type:    Repair type:  Simple Post-procedure details:    Dressing:  Non-adherent dressing   Procedure completion:  Tolerated well, no immediate complications     Medications Ordered in ED Medications  Tdap (BOOSTRIX) injection 0.5 mL (0.5 mLs Intramuscular Given 11/19/23 2239)  lidocaine-EPINEPHrine (XYLOCAINE W/EPI) 2 %-1:200000 (PF) injection 10 mL (10 mLs Infiltration Given 11/19/23 2303)    ED Course/ Medical Decision Making/ A&P                                 Medical Decision Making Risk Prescription drug management.   This patient presents to the ED for concern of laceration R hand, this involves an extensive number of treatment options, and is a  complaint that carries with it a high risk of complications and morbidity.  The differential diagnosis includes simple laceration vs FB vs tendon injury vs vascular injury.   Co morbidities that complicate the patient evaluation  Asthma Depression    Additional history obtained:  Additional history obtained from family, at bedside   Cardiac Monitoring:  The patient was  maintained on a cardiac monitor.  I personally viewed and interpreted the cardiac monitored which showed an underlying rhythm of: NSR   Medicines ordered and prescription drug management:  I ordered medication including lidocaine for pain  Reevaluation of the patient after these medicines showed that the patient improved I have reviewed the patients home medicines and have made adjustments as needed   Test Considered:  Xray R hand   Problem List / ED Course:  Tdap booster given. Pressure irrigation performed. Laceration occurred < 8 hours prior to repair which was well tolerated. Pt has no co morbidities to effect normal wound healing. Discussed suture home care with pt and answered questions. Pt to f-u for wound check and suture removal in 10-12 days.    Reevaluation:  After the interventions noted above, I reevaluated the patient and found that they have : remained stable   Dispostion:  After consideration of the diagnostic results and the patients response to treatment, I feel that the patent would benefit from home wound care, suture removal in 10-12 days. Return precautions discussed and provided. Patient discharged in stable condition with no unaddressed concerns.          Final Clinical Impression(s) / ED Diagnoses Final diagnoses:  Laceration of right hand without foreign body, initial encounter    Rx / DC Orders ED Discharge Orders     None         Antony Madura, PA-C 11/19/23 2349    Nira Conn, MD 11/20/23 [phone]

## 2023-11-19 NOTE — ED Notes (Signed)
Removed previous dressing from palm of right hand. Laceration cleaned with wound cleanser and 4x4 guaze placed. Awaiting provider.

## 2023-11-19 NOTE — ED Triage Notes (Signed)
Pt fell on hands and feet at competition around 2130 and right hand landed on the head part of the screw. Laceration to palm of right hand. Denies hitting head or LOC. Denies any numbness or tingling in fingertips. Active movement of fingers noted.

## 2023-11-23 ENCOUNTER — Ambulatory Visit
Admission: RE | Admit: 2023-11-23 | Discharge: 2023-11-23 | Disposition: A | Discharge status: Home / Self Care | Payer: BC Managed Care – PPO | Source: Ambulatory Visit | Attending: Family Medicine | Admitting: Family Medicine

## 2023-11-23 ENCOUNTER — Other Ambulatory Visit: Payer: Self-pay

## 2023-11-23 VITALS — BP 105/69 | HR 61 | Temp 97.6°F | Resp 16

## 2023-11-23 DIAGNOSIS — S61411D Laceration without foreign body of right hand, subsequent encounter: Secondary | ICD-10-CM | POA: Diagnosis not present

## 2023-11-23 NOTE — Discharge Instructions (Signed)
Sutures out in 10 days This may become wet briefly and pat dry Return sooner if any signs of infection develop

## 2023-11-23 NOTE — ED Provider Notes (Signed)
[facility]    CSN: [id number] Arrival date & time: 11/23/23  0941      History   Chief Complaint Chief Complaint  Patient presents with   Hand Problem    HPI Benjamin Carpenter is a 22 y.o. male.   HPI  Patient lacerated his hand accidentally at home several days ago.  He is here for a wound check.  He does not like the way it looks.  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 No pertinent surgical history.     Home Medications    Prior to Admission medications   Medication Sig Start Date End Date Taking? Authorizing Provider  albuterol (PROVENTIL) (2.5 MG/3ML) 0.083% nebulizer solution Take 3 mLs (2.5 mg total) by nebulization every 6 (six) hours as needed for wheezing or shortness of breath. 12/15/22   Christen Butter, NP  albuterol (VENTOLIN HFA) 108 (90 Base) MCG/ACT inhaler Inhale 1-2 puffs into the lungs every 6 (six) hours as needed for wheezing or shortness of breath. 04/08/23   Monica Becton, MD  beclomethasone (QVAR REDIHALER) 80 MCG/ACT inhaler Inhale 1 puff into the lungs 2 (two) times daily. 06/24/23   Monica Becton, MD  cloNIDine  (CATAPRES) 0.2 MG tablet Take 1 tablet (0.2 mg total) by mouth at bedtime. 05/13/23   Monica Becton, MD  FLUoxetine (PROZAC) 40 MG capsule Take 1 capsule (40 mg total) by mouth daily. 08/17/23   Monica Becton, MD    Family History Family History  Problem Relation Age of Onset   Hypertension Maternal Grandfather    Heart attack Maternal Grandfather     Social History Social History   Tobacco Use   Smoking status: Never   Smokeless tobacco: Never  Vaping Use   Vaping status: Never Used  Substance Use Topics   Alcohol use: Never   Drug use: Never     Allergies   Corn oil, Northern quahog clam (m. mercenaria) skin test, Singulair [montelukast], Tilactase, and Wheat   Review of Systems Review of Systems See HPI  Physical Exam Triage Vital Signs ED Triage Vitals  Encounter Vitals Group     BP 11/23/23 0954 105/69     Systolic BP Percentile --      Diastolic BP Percentile --      Pulse Rate 11/23/23 0954 61     Resp 11/23/23 0954 16     Temp 11/23/23 0954 97.6 F (36.4 C)     Temp src --      SpO2 11/23/23 0954 98 %     Weight --      Height --      Head Circumference --  Peak Flow --      Pain Score 11/23/23 0958 3     Pain Loc --      Pain Education --      Exclude from Growth Chart --    No data found.  Updated Vital Signs BP 105/69   Pulse 61   Temp 97.6 F (36.4 C)   Resp 16   SpO2 98%       Physical Exam Constitutional:      General: He is not in acute distress.    Appearance: Normal appearance. He is well-developed and normal weight.  HENT:     Head: Normocephalic and atraumatic.  Eyes:     Pupils: Pupils are equal, round, and reactive to light.  Cardiovascular:     Rate and Rhythm: Normal rate.  Pulmonary:     Effort: Pulmonary effort is normal.  Musculoskeletal:        General: Normal range of motion.     Cervical back: Normal range of motion.  Skin:    General: Skin is warm and dry.     Findings: No lesion.      Comments: Patient has a laceration in the center of his palm that appears to have been repaired with vertical mattress sutures.  The skin is quite white and macerated although there is no evidence of drainage or erythema to indicate infection  Neurological:     Mental Status: He is alert.      UC Treatments / Results  Labs (all labs ordered are listed, but only abnormal results are displayed) Labs Reviewed - No data to display  EKG   Radiology No results found.  Procedures Procedures (including critical care time)  Medications Ordered in UC Medications - No data to display  Initial Impression / Assessment and Plan / UC Course  I have reviewed the triage vital signs and the nursing notes.  Pertinent labs & imaging results that were available during my care of the patient were reviewed by me and considered in my medical decision making (see chart for details).     Wound care as discussed Final Clinical Impressions(s) / UC Diagnoses   Final diagnoses:  Laceration of right hand without foreign body, subsequent encounter     Discharge Instructions      Sutures out in 10 days This may become wet briefly and pat dry Return sooner if any signs of infection develop   ED Prescriptions   None    PDMP not reviewed this encounter.   Eustace Moore, MD 11/23/23 [phone]

## 2023-11-23 NOTE — ED Triage Notes (Addendum)
Had stitches placed Saturday night and feels they are not healing properly. Site reopened yesterday while cleaning, per patient.

## 2023-11-24 ENCOUNTER — Telehealth: Payer: Self-pay

## 2023-11-24 NOTE — Telephone Encounter (Signed)
Patient seen in ER 11/23/23 Issue addressed.

## 2023-11-24 NOTE — Telephone Encounter (Signed)
Copied from CRM [id number]. Topic: Appointments - Scheduling Inquiry for Clinic >> Nov 22, 2023  9:28 AM Orinda Kenner C wrote: Reason for CRM: Pt's mother Devantae Sellman [phone] states pt went to the ER Saturday night, got stitches on R hand. Unwrapped it yesterday, looks like muscle hanging out, still bleeding, redness and hurting pretty bad. Has an upcoming appt for 11/30/23, but feels like pt needs to be seen today. There's no available appts for today. Stanton Kidney would like nurse to advise, pls c/b.

## 2023-11-30 ENCOUNTER — Ambulatory Visit (INDEPENDENT_AMBULATORY_CARE_PROVIDER_SITE_OTHER): Payer: BC Managed Care – PPO | Admitting: Sports Medicine

## 2023-11-30 DIAGNOSIS — S61412A Laceration without foreign body of left hand, initial encounter: Secondary | ICD-10-CM | POA: Insufficient documentation

## 2023-11-30 DIAGNOSIS — S61412D Laceration without foreign body of left hand, subsequent encounter: Secondary | ICD-10-CM | POA: Diagnosis not present

## 2023-11-30 NOTE — Assessment & Plan Note (Signed)
10 days status post fall with accidental laceration left palm, was seen in the ED, had a couple of horizontal mattress sutures placed. He is doing well, wound appears clean, dry, intact, sutures removed, Dermabond placed. Up-to-date on tetanus.

## 2023-11-30 NOTE — Progress Notes (Signed)
    Procedures performed today:    None.  Independent interpretation of notes and tests performed by another provider:   None.  Brief History, Exam, Impression, and Recommendations:    Laceration of hand, left 10 days status post fall with accidental laceration left palm, was seen in the ED, had a couple of horizontal mattress sutures placed. He is doing well, wound appears clean, dry, intact, sutures removed, Dermabond placed. Up-to-date on tetanus.    ____________________________________________ [name]. Benjamin Stain, M.D., ABFM., CAQSM., AME. Primary Care and Sports Medicine [facility]  Adjunct Professor of Family Medicine  [employer]  Restaurant manager, fast food

## 2024-01-30 ENCOUNTER — Other Ambulatory Visit: Payer: Self-pay | Admitting: Sports Medicine

## 2024-01-30 DIAGNOSIS — F411 Generalized anxiety disorder: Secondary | ICD-10-CM

## 2024-04-10 ENCOUNTER — Other Ambulatory Visit: Payer: Self-pay | Admitting: Sports Medicine

## 2024-04-10 DIAGNOSIS — J4530 Mild persistent asthma, uncomplicated: Secondary | ICD-10-CM

## 2024-04-20 ENCOUNTER — Ambulatory Visit: Payer: Self-pay

## 2024-04-20 NOTE — Telephone Encounter (Signed)
 FYI

## 2024-04-20 NOTE — Telephone Encounter (Signed)
 Copied from CRM [id number]. Topic: Clinical - Red Word Triage >> Apr 20, 2024  1:29 PM Danelle Dunning F wrote: [facility] that prompted transfer to Nurse Triage:   Left arm injury; Can't lift it; extremely painful   Chief Complaint: Shoulder injury Symptoms: Left shoulder injury, difficulty lifting arm Frequency: Constant  Pertinent Negatives: Patient denies any other symptom  Disposition: [] ED /[x] Urgent Care (no appt availability in office) / [] Appointment(In office/virtual)/ []  [facility]/ [] Home Care/ [] Refused Recommended Disposition /[] [facility]/ []  Follow-up with PCP Additional Notes: Patient's mother called to reports that the patient had a left shoulder injury earlier this week that has been worsening. She states that he is having difficulty lifting his arm due to the pain. She states he has no other symptoms at this time. I advised there are no appointments available today and that he should go to urgent care for evaluation of his shoulder. She understood and is agreeable with this plan.     Reason for Disposition  Can't move injured shoulder normally (e.g., full range of motion, able to touch top of head)  Answer Assessment - Initial Assessment Questions 1. MECHANISM: "How did the injury happen?"     Unsure  2. ONSET: "When did the injury happen?" (Minutes or hours ago)      This week 3. APPEARANCE of INJURY: "What does the injury look like?"      No deformity  4. SEVERITY: "Can you move the shoulder normally?"      Unable to lift arm 5. SIZE: For cuts, bruises, or swelling, ask: "How large is it?" (e.g., inches or centimeters;  entire joint)      N/A 6. PAIN: "Is there pain?" If Yes, ask: "How bad is the pain?"   (e.g., Scale 1-10; or mild, moderate, severe)   - MILD (1-3): doesn't interfere with normal activities   - MODERATE (4-7): interferes with normal activities (e.g., work or school) or awakens from sleep   - SEVERE (8-10): excruciating pain, unable to  do any normal activities, unable to move arm at all due to pain     Moderate to severe  7. TETANUS: For any breaks in the skin, ask: "When was the last tetanus booster?"     N/A 8. OTHER SYMPTOMS: "Do you have any other symptoms?" (e.g., loss of sensation)     No  Protocols used: Shoulder Injury-A-AH

## 2024-04-21 ENCOUNTER — Ambulatory Visit
Admission: RE | Admit: 2024-04-21 | Discharge: 2024-04-21 | Disposition: A | Discharge status: Home / Self Care | Source: Ambulatory Visit | Attending: Family Medicine | Admitting: Family Medicine

## 2024-04-21 VITALS — BP 114/73 | HR 62 | Temp 98.8°F | Resp 18 | Ht 63.0 in | Wt 130.0 lb

## 2024-04-21 DIAGNOSIS — M778 Other enthesopathies, not elsewhere classified: Secondary | ICD-10-CM

## 2024-04-21 DIAGNOSIS — M7582 Other shoulder lesions, left shoulder: Secondary | ICD-10-CM

## 2024-04-21 MED ORDER — METHYLPREDNISOLONE 4 MG PO TBPK: ORAL_TABLET | ORAL | 0 refills | Status: DC

## 2024-04-21 NOTE — ED Triage Notes (Signed)
 Patient c/o left shoulder injury x 6 days ago from a workout or fall.  Patient states it did not start bothering him till x 3 days.  Patient has taken Ibuprofen, heating pad and ice packs.

## 2024-04-21 NOTE — Discharge Instructions (Signed)
 Take the Medrol Dosepak as directed.  Take all of day 1 today Limit use of shoulder while painful Continue with ice for 20 minutes every couple of hours See Dr. Elva Hamburger if not improving in a few days

## 2024-04-21 NOTE — ED Provider Notes (Signed)
 [facility]    CSN: [id number] Arrival date & time: 04/21/24  0817      History   Chief Complaint Chief Complaint  Patient presents with   Shoulder Injury    Entered by patient    HPI QUEEN WEYMAN is a 23 y.o. male.   Pleasant 23 year old.  Known to me from prior visit.  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  He is here for acute left shoulder pain.  Does not know how he injured his shoulder.  He states that he did fall in a gymnastics competition, but felt no pain and kept competing.  He also does a lot of heavy lifting.  Certain movements are painful with his shoulder.  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 No pertinent surgical history.     Home Medications    Prior to Admission medications   Medication Sig Start Date End Date Taking? Authorizing Provider  beclomethasone (QVAR  REDIHALER) 80 MCG/ACT inhaler Inhale 1 puff into the lungs 2 (two) times daily. 06/24/23  Yes Gean Keels, MD  cloNIDine  (CATAPRES ) 0.2 MG tablet Take 1 tablet (0.2 mg total) by mouth at bedtime. 05/13/23  Yes Gean Keels, MD  FLUoxetine  (PROZAC )  40 MG capsule TAKE 1 CAPSULE (40 MG TOTAL) BY MOUTH DAILY. 01/31/24  Yes Gean Keels, MD  methylPREDNISolone (MEDROL DOSEPAK) 4 MG TBPK tablet tad 04/21/24  Yes Stephany Ehrich, MD  VENTOLIN  HFA 108 (90 Base) MCG/ACT inhaler INHALE 1-2 PUFFS INTO THE LUNGS EVERY 6 HOURS AS NEEDED FOR WHEEZING OR SHORTNESS OF BREATH. 04/10/24  Yes Gean Keels, MD  albuterol  (PROVENTIL ) (2.5 MG/3ML) 0.083% nebulizer solution Take 3 mLs (2.5 mg total) by nebulization every 6 (six) hours as needed for wheezing or shortness of breath. 12/15/22   Cherre Cornish, NP    Family History Family History  Problem Relation Age of Onset   Healthy Mother    Healthy Father    Hypertension Maternal Grandfather    Heart attack Maternal Grandfather     Social History Social History   Tobacco Use   Smoking status: Never   Smokeless tobacco: Never  Vaping Use   Vaping status: Never Used  Substance Use Topics   Alcohol use: Never   Drug use: Never     Allergies   Corn oil, Northern quahog clam (m. mercenaria) skin test, Singulair  [montelukast ], Tilactase, and Wheat   Review of Systems Review of Systems See HPI  Physical Exam Triage Vital Signs ED Triage Vitals  Encounter Vitals Group     BP 04/21/24 0829 114/73     Systolic BP Percentile --      Diastolic BP Percentile --  Pulse Rate 04/21/24 0829 62     Resp 04/21/24 0829 18     Temp 04/21/24 0829 98.8 F (37.1 C)     Temp Source 04/21/24 0829 Oral     SpO2 04/21/24 0829 98 %     Weight 04/21/24 0830 130 lb (59 kg)     Height 04/21/24 0830 5\' 3"  (1.6 m)     Head Circumference --      Peak Flow --      Pain Score 04/21/24 0830 8     Pain Loc --      Pain Education --      Exclude from Growth Chart --    No data found.  Updated Vital Signs BP 114/73 (BP Location: Right Arm)   Pulse 62   Temp 98.8 F (37.1 C) (Oral)   Resp 18   Ht 5\' 3"  (1.6 m)   Wt 59 kg   SpO2 98%   BMI 23.03 kg/m       Physical  Exam Constitutional:      General: He is not in acute distress.    Appearance: He is well-developed and normal weight.  HENT:     Head: Normocephalic and atraumatic.  Eyes:     Conjunctiva/sclera: Conjunctivae normal.     Pupils: Pupils are equal, round, and reactive to light.  Cardiovascular:     Rate and Rhythm: Normal rate.  Pulmonary:     Effort: Pulmonary effort is normal. No respiratory distress.  Abdominal:     General: There is no distension.     Palpations: Abdomen is soft.  Musculoskeletal:        General: Tenderness present. Normal range of motion.     Cervical back: Normal range of motion.     Comments: Mild tenderness anterior shoulder.  Patient has limited extension and abduction.  He has internal but lacks external rotation.  No weakness.  Skin:    General: Skin is warm and dry.  Neurological:     Mental Status: He is alert.      UC Treatments / Results  Labs (all labs ordered are listed, but only abnormal results are displayed) Labs Reviewed - No data to display  EKG   Radiology No results found.  Procedures Procedures (including critical care time)  Medications Ordered in UC Medications - No data to display  Initial Impression / Assessment and Plan / UC Course  I have reviewed the triage vital signs and the nursing notes.  Pertinent labs & imaging results that were available during my care of the patient were reviewed by me and considered in my medical decision making (see chart for details).     Tendinitis left shoulder.  Discussed rest, ice, anti-inflammatory medications.  See Dr. Elva Hamburger if fails to improve Final Clinical Impressions(s) / UC Diagnoses   Final diagnoses:  Left shoulder tendinitis     Discharge Instructions      Take the Medrol Dosepak as directed.  Take all of day 1 today Limit use of shoulder while painful Continue with ice for 20 minutes every couple of hours See Dr. Elva Hamburger if not improving in a few days  ED Prescriptions      Medication Sig Dispense Auth. Provider   methylPREDNISolone (MEDROL DOSEPAK) 4 MG TBPK tablet tad 21 tablet Stephany Ehrich, MD      PDMP not reviewed this encounter.   Stephany Ehrich, MD 04/21/24 1003

## 2024-04-30 ENCOUNTER — Ambulatory Visit (INDEPENDENT_AMBULATORY_CARE_PROVIDER_SITE_OTHER): Admitting: Sports Medicine

## 2024-04-30 DIAGNOSIS — S46012A Strain of muscle(s) and tendon(s) of the rotator cuff of left shoulder, initial encounter: Secondary | ICD-10-CM | POA: Diagnosis not present

## 2024-04-30 NOTE — Assessment & Plan Note (Signed)
 Very pleasant previously healthy 23 year old rockclimbing, couple weeks pain left shoulder over the deltoid, was seen in urgent care, prednisone  seem to help the pain, on exam he has good strength, good motion. No impingement signs, I do suspect rotator cuff strain, we have added some cuff conditioning exercises and Thera-Band for preventative purposes, return as needed.

## 2024-04-30 NOTE — Progress Notes (Signed)
    Procedures performed today:    None.  Independent interpretation of notes and tests performed by another provider:   None.  Brief History, Exam, Impression, and Recommendations:    Rotator cuff strain, left, initial encounter Very pleasant previously healthy 23 year old rockclimbing, couple weeks pain left shoulder over the deltoid, was seen in urgent care, prednisone  seem to help the pain, on exam he has good strength, good motion. No impingement signs, I do suspect rotator cuff strain, we have added some cuff conditioning exercises and Thera-Band for preventative purposes, return as needed.    ____________________________________________ Joselyn Nicely. Sandy Crumb, M.D., ABFM., CAQSM., AME. Primary Care and Sports Medicine [facility]  Adjunct Professor of [facility] [facility]  [employer]  Restaurant manager, fast food

## 2024-05-22 ENCOUNTER — Encounter (INDEPENDENT_AMBULATORY_CARE_PROVIDER_SITE_OTHER): Payer: Self-pay | Admitting: Sports Medicine

## 2024-05-22 DIAGNOSIS — F411 Generalized anxiety disorder: Secondary | ICD-10-CM

## 2024-05-22 MED ORDER — BUSPIRONE HCL 5 MG PO TABS
5.0000 mg | ORAL_TABLET | Freq: Two times a day (BID) | ORAL | 3 refills | Status: DC
Start: 2024-05-22 — End: 2024-10-30

## 2024-05-22 NOTE — Telephone Encounter (Signed)

## 2024-06-05 ENCOUNTER — Encounter: Payer: Self-pay | Admitting: Sports Medicine

## 2024-06-06 ENCOUNTER — Other Ambulatory Visit: Payer: Self-pay

## 2024-06-06 MED ORDER — CLONIDINE HCL 0.2 MG PO TABS: 0.2000 mg | ORAL_TABLET | Freq: Every day | ORAL | 3 refills | Status: DC

## 2024-06-06 NOTE — Telephone Encounter (Signed)
 Requesting rx rf of Clonidine  0.2mg   Last written 05/13/2023 Last OV 04/30/2024 Upcoming appt = none

## 2024-06-14 ENCOUNTER — Other Ambulatory Visit: Payer: Self-pay | Admitting: Sports Medicine

## 2024-06-14 DIAGNOSIS — F411 Generalized anxiety disorder: Secondary | ICD-10-CM

## 2024-07-09 ENCOUNTER — Other Ambulatory Visit: Payer: Self-pay | Admitting: Sports Medicine

## 2024-07-09 DIAGNOSIS — J4530 Mild persistent asthma, uncomplicated: Secondary | ICD-10-CM

## 2024-08-14 ENCOUNTER — Encounter: Payer: Self-pay | Admitting: Sports Medicine

## 2024-09-19 ENCOUNTER — Other Ambulatory Visit: Payer: Self-pay

## 2024-09-19 DIAGNOSIS — J4530 Mild persistent asthma, uncomplicated: Secondary | ICD-10-CM

## 2024-09-19 MED ORDER — VENTOLIN HFA 108 (90 BASE) MCG/ACT IN AERS: 2.0000 | INHALATION_SPRAY | Freq: Four times a day (QID) | RESPIRATORY_TRACT | 0 refills | Status: DC | PRN

## 2024-09-19 NOTE — Telephone Encounter (Signed)
 Forwarding message to Dr. Alvia covering Dr. Jenifer   Requesting rx rf of  Ventolin  HFA  Last written 07/09/2024 Last OV 04/30/2024 Upcoming appt = none  Dx showing in chart of mild persistent asthma

## 2024-09-19 NOTE — Telephone Encounter (Signed)
 Copied from CRM [id number]. Topic: Clinical - Medication Question >> Sep 19, 2024 10:11 AM Nathanel BROCKS wrote: Reason for CRM: [facility] called to see if we got a fax from them for the VENTOLIN  HFA 108 (90 Base) MCG/ACT inhaler They are refaxing so be on the look out for it. Any problems please call [phone] Lon.

## 2024-10-04 ENCOUNTER — Telehealth: Payer: Self-pay

## 2024-10-04 DIAGNOSIS — J4530 Mild persistent asthma, uncomplicated: Secondary | ICD-10-CM

## 2024-10-04 NOTE — Telephone Encounter (Signed)
 Forwarding to covering provider Vermell Epley covering DR. [name].  Requesting rx rf of Qvar  Last written 07/09/2024 Last OV 04/30/2024 Upcoming appt none

## 2024-10-04 NOTE — Telephone Encounter (Signed)
 Copied from CRM [id number]. Topic: Clinical - Prescription Issue >> Oct 04, 2024 10:30 AM Anairis L wrote: Reason for CRM: Sam from [facility] following up on fax sent in 10/02/2024 for beclomethasone (QVAR  REDIHALER) 80 MCG/ACT inhaler

## 2024-10-05 MED ORDER — QVAR REDIHALER 80 MCG/ACT IN AERB: 1.0000 | INHALATION_SPRAY | Freq: Two times a day (BID) | RESPIRATORY_TRACT | 1 refills | Status: DC

## 2024-10-05 NOTE — Addendum Note (Signed)
 Addended by: ANTONIETTE VERMELL CROME on: 10/05/2024 10:06 AM   Modules accepted: Orders

## 2024-10-16 ENCOUNTER — Other Ambulatory Visit: Payer: Self-pay

## 2024-10-16 ENCOUNTER — Other Ambulatory Visit: Payer: Self-pay | Admitting: Sports Medicine

## 2024-10-16 DIAGNOSIS — F411 Generalized anxiety disorder: Secondary | ICD-10-CM

## 2024-10-16 NOTE — Telephone Encounter (Signed)
 Would you  please assist this patient in scheduling a TOC appt to Zada Palin, NP . Thank you :)

## 2024-10-16 NOTE — Telephone Encounter (Unsigned)
 Copied from CRM [id number]. Topic: Clinical - Medication Refill >> Oct 16, 2024 11:08 AM [name] wrote: Medication: FLUoxetine  (PROZAC ) 40 MG capsule  Has the patient contacted their pharmacy? Yes (Agent: If no, request that the patient contact the pharmacy for the refill. If patient does not wish to contact the pharmacy document the reason why and proceed with request.) (Agent: If yes, when and what did the pharmacy advise?)  This is the patient's preferred pharmacy:  [facility] [place], [place] - [address] [place] [address] Phone: [phone] Fax: [phone]  Is this the correct pharmacy for this prescription? Yes If no, delete pharmacy and type the correct one.   Has the prescription been filled recently? No  Is the patient out of the medication? Yes  Has the patient been seen for an appointment in the last year OR does the patient have an upcoming appointment? Yes  Can we respond through MyChart? Yes  Agent: Please be advised that Rx refills may take up to 3 business days. We ask that you follow-up with your pharmacy.

## 2024-10-16 NOTE — Telephone Encounter (Signed)
 Copied from CRM [id number]. Topic: Appointments - Transfer of Care >> Oct 16, 2024 11:18 AM [name] wrote: Pt is requesting to transfer FROM: [name] Pt is requesting to transfer TO: Zada Palin Reason for requested transfer: Provider left practice It is the responsibility of the team the patient would like to transfer to [facility]) to reach out to the patient if for any reason this transfer is not acceptable.

## 2024-10-17 MED ORDER — FLUOXETINE HCL 40 MG PO CAPS: ORAL_CAPSULE | ORAL | 0 refills | Status: DC

## 2024-10-17 NOTE — Telephone Encounter (Signed)
 Patient scheduled for TOC to Joy Jessup for 10/30/2024

## 2024-10-17 NOTE — Telephone Encounter (Signed)
Fluoxetine refill sent

## 2024-10-17 NOTE — Telephone Encounter (Signed)
 Copied from CRM [id number]. Topic: Clinical - Medication Question >> Oct 17, 2024 12:41 PM Sophia H wrote: Reason for CRM: Mother Barnie states that patient is out of his FLUoxetine  (PROZAC ) 40 MG capsule. PCP no longer at clinic, states were trying to do a TOC to [employer] but no response yet. Mother states patient cannot go without medication, needing filled ASAP pt took last pill today.  Per chart notes appt with Joy needs to be scheduled, pt said he would call back but mother would like to go ahead and schedule on his behalf if possible. Listed on DPR, when mother called [facility] was closed for lunch. Please reach out to mother for scheduling, ty -  # [phone]   [facility] [place], [place] - [address]

## 2024-10-17 NOTE — Telephone Encounter (Signed)
 Message sent to patient via Mychart regarding the refill and upcoming appt.

## 2024-10-17 NOTE — Telephone Encounter (Signed)
 Spoke with patient mother.  Patient is scheduled for TOC with Zada Palin, NP for 12/31/2023 Wanting to know if a one month prescription refill of Fluoxetine  could be sent until his upcoming appt  He is out of medication   Last written 06/14/2024 Last OV 04/30/2024 Upcoming appt 12/31/2023

## 2024-10-18 MED ORDER — FLUOXETINE HCL 40 MG PO CAPS: ORAL_CAPSULE | ORAL | 0 refills | Status: DC

## 2024-10-18 NOTE — Telephone Encounter (Signed)
 Last read by Fonda LILLETTE Delores Sidra at 9:26PM on 10/17/2024.

## 2024-10-30 ENCOUNTER — Ambulatory Visit (INDEPENDENT_AMBULATORY_CARE_PROVIDER_SITE_OTHER)

## 2024-10-30 ENCOUNTER — Ambulatory Visit (INDEPENDENT_AMBULATORY_CARE_PROVIDER_SITE_OTHER): Admitting: Medical-Surgical

## 2024-10-30 ENCOUNTER — Encounter: Payer: Self-pay | Admitting: Medical-Surgical

## 2024-10-30 VITALS — BP 119/70 | HR 66 | Resp 20 | Ht 63.0 in | Wt 132.0 lb

## 2024-10-30 DIAGNOSIS — S46012D Strain of muscle(s) and tendon(s) of the rotator cuff of left shoulder, subsequent encounter: Secondary | ICD-10-CM

## 2024-10-30 DIAGNOSIS — F411 Generalized anxiety disorder: Secondary | ICD-10-CM

## 2024-10-30 DIAGNOSIS — F5104 Psychophysiologic insomnia: Secondary | ICD-10-CM

## 2024-10-30 DIAGNOSIS — J4530 Mild persistent asthma, uncomplicated: Secondary | ICD-10-CM | POA: Diagnosis not present

## 2024-10-30 DIAGNOSIS — F429 Obsessive-compulsive disorder, unspecified: Secondary | ICD-10-CM

## 2024-10-30 DIAGNOSIS — Z7689 Persons encountering health services in other specified circumstances: Secondary | ICD-10-CM

## 2024-10-30 MED ORDER — VENTOLIN HFA 108 (90 BASE) MCG/ACT IN AERS: 2.0000 | INHALATION_SPRAY | Freq: Four times a day (QID) | RESPIRATORY_TRACT | 6 refills | Status: AC | PRN

## 2024-10-30 MED ORDER — BUSPIRONE HCL 5 MG PO TABS: 5.0000 mg | ORAL_TABLET | Freq: Two times a day (BID) | ORAL | 3 refills | Status: AC

## 2024-10-30 MED ORDER — CLONIDINE HCL 0.2 MG PO TABS: 0.2000 mg | ORAL_TABLET | Freq: Every day | ORAL | 3 refills | Status: AC

## 2024-10-30 MED ORDER — QVAR REDIHALER 80 MCG/ACT IN AERB: 1.0000 | INHALATION_SPRAY | Freq: Two times a day (BID) | RESPIRATORY_TRACT | 6 refills | Status: AC

## 2024-10-30 MED ORDER — FLUOXETINE HCL 40 MG PO CAPS: ORAL_CAPSULE | ORAL | 3 refills | Status: AC

## 2024-10-30 NOTE — Progress Notes (Signed)
        Established patient visit  History, exam, impression, and plan:  1. Generalized anxiety disorder 2.  OCD Pleasant 23 year old male presenting today to establish care with a new PCP as his prior PCP is no longer with the practice.  Currently taking fluoxetine  40 mg daily and has BuSpar  5 mg twice daily as needed for severe anxiety.  Reports doing well on the fluoxetine  and feels medication is effective.  Has not had to use BuSpar  in a while.  Anxiety is well-controlled.  Reports that he is not sure that he actually has OCD.  Happy with his current medication regimen and does not desire any changes at this time.  Continue fluoxetine  40 mg daily.  Continue BuSpar  as needed. - FLUoxetine  (PROZAC ) 40 MG capsule; TAKE 1 CAPSULE (40 MG TOTAL) BY MOUTH ONCE DAILY.  Dispense: 90 capsule; Refill: 3 - busPIRone  (BUSPAR ) 5 MG tablet; Take 1 tablet (5 mg total) by mouth 2 (two) times daily.  Dispense: 60 tablet; Refill: 3  3. Mild persistent asthma without complication History of mild persistent asthma that is well-managed with Qvar  1 puff twice daily.  Has albuterol  inhaler for rescue as needed.  Has not had to use this in a while and feels that his asthma is not a big issue.  Able to perform physical exercises without significant difficulty. - beclomethasone (QVAR  REDIHALER) 80 MCG/ACT inhaler; Inhale 1 puff into the lungs 2 (two) times daily.  Dispense: 10.6 g; Refill: 6 - VENTOLIN  HFA 108 (90 Base) MCG/ACT inhaler; Inhale 2 puffs into the lungs every 6 (six) hours as needed for wheezing or shortness of breath.  Dispense: 18 g; Refill: 6  4. Psychophysiological insomnia (Primary) Currently taking clonidine  0.2 mg at bedtime which is working well.  No difficulty with sleeping or waking in the middle of the night.  Tolerates the medication well and has no concerning side effects.  Blood pressure is stable.  Continue clonidine  as prescribed. - cloNIDine  (CATAPRES ) 0.2 MG tablet; Take 1 tablet (0.2 mg  total) by mouth at bedtime.  Dispense: 90 tablet; Refill: 3  4. Rotator cuff strain, left, subsequent encounter Exercises regularly and likes to compete however has not been able to do this recently.  At the competition back in May, he subluxed his left shoulder causing rotator cuff strain.  Did some physical therapy exercises but this seemed to exacerbate the pain rather than help it.  Not currently taking any anti-inflammatories.  Currently has no pain however notes that the shoulder has felt off since the injury.  Feels that the shoulder is less flexible and weak compared to the right side.  On review, no imaging has been done so we will start with a basic left shoulder x-ray.  Concern for rotator cuff tear with possible mild adhesive capsulitis.  Okay to use ibuprofen or Aleve if needed.  Continues to exercise regularly but has not noted any improvement.  At this point, has failed to regain prior function despite greater than 6 weeks of conservative measures in the last few months.  MRI likely needed for further evaluation but will wait for x-ray results. - DG Shoulder Left; Future  Procedures performed this visit: None.  Return in about 1 year (around 10/30/2025) for mood follow up.  __________________________________ Zada FREDRIK Palin, DNP, APRN, FNP-BC Primary Care and Sports Medicine [facility] [place]

## 2024-11-02 ENCOUNTER — Ambulatory Visit: Payer: Self-pay | Admitting: Medical-Surgical

## 2024-11-02 DIAGNOSIS — S46012D Strain of muscle(s) and tendon(s) of the rotator cuff of left shoulder, subsequent encounter: Secondary | ICD-10-CM

## 2024-11-05 NOTE — Telephone Encounter (Signed)
Referral placed. ___________________________________________ Jurell Basista L. Aalyssa Elderkin, DNP, APRN, FNP-BC Primary Care and Sports Medicine [facility]  

## 2025-10-21 ENCOUNTER — Ambulatory Visit: Admitting: Medical-Surgical
